# Patient Record
Sex: Male | Born: 1975 | Race: White | Hispanic: No | Marital: Single | Smoking: Never smoker
Health system: Southern US, Community
[De-identification: ages and names within clinical notes are randomized; demographics above are authoritative.]

## PROBLEM LIST (undated history)

## (undated) DIAGNOSIS — F32A Depression, unspecified: Secondary | ICD-10-CM

## (undated) DIAGNOSIS — F329 Major depressive disorder, single episode, unspecified: Secondary | ICD-10-CM

## (undated) DIAGNOSIS — F419 Anxiety disorder, unspecified: Secondary | ICD-10-CM

## (undated) DIAGNOSIS — Z8719 Personal history of other diseases of the digestive system: Secondary | ICD-10-CM

## (undated) HISTORY — PX: OTHER SURGICAL HISTORY: SHX169

## (undated) HISTORY — DX: Personal history of other diseases of the digestive system: Z87.19

---

## 2004-05-06 ENCOUNTER — Emergency Department (HOSPITAL_COMMUNITY): Admission: EM | Admit: 2004-05-06 | Discharge: 2004-05-06 | Payer: Self-pay | Admitting: Emergency Medicine

## 2004-06-18 ENCOUNTER — Encounter: Admission: RE | Admit: 2004-06-18 | Discharge: 2004-06-18 | Payer: Self-pay | Admitting: General Surgery

## 2004-07-07 ENCOUNTER — Inpatient Hospital Stay (HOSPITAL_COMMUNITY): Admission: RE | Admit: 2004-07-07 | Discharge: 2004-07-11 | Payer: Self-pay | Admitting: General Surgery

## 2004-07-07 ENCOUNTER — Encounter (INDEPENDENT_AMBULATORY_CARE_PROVIDER_SITE_OTHER): Payer: Self-pay | Admitting: *Deleted

## 2008-07-03 ENCOUNTER — Encounter: Payer: Self-pay | Admitting: Family Medicine

## 2008-07-15 ENCOUNTER — Emergency Department: Payer: Self-pay | Admitting: Emergency Medicine

## 2008-07-24 ENCOUNTER — Ambulatory Visit: Payer: Self-pay | Admitting: Family Medicine

## 2008-07-24 DIAGNOSIS — Z8719 Personal history of other diseases of the digestive system: Secondary | ICD-10-CM

## 2008-07-24 DIAGNOSIS — IMO0002 Reserved for concepts with insufficient information to code with codable children: Secondary | ICD-10-CM

## 2008-08-09 ENCOUNTER — Ambulatory Visit: Payer: Self-pay | Admitting: Family Medicine

## 2010-01-14 ENCOUNTER — Ambulatory Visit: Payer: Self-pay | Admitting: Family Medicine

## 2010-01-23 ENCOUNTER — Ambulatory Visit: Payer: Self-pay | Admitting: Family Medicine

## 2010-01-29 ENCOUNTER — Telehealth: Payer: Self-pay | Admitting: Family Medicine

## 2010-08-26 NOTE — Assessment & Plan Note (Signed)
Summary: CPX FOR DMV/CLE   Vital Signs:  Patient profile:   35 year old male Height:      72 inches Weight:      290.2 pounds BMI:     39.50 Temp:     97.7 degrees F oral Pulse rate:   64 / minute Pulse rhythm:   regular BP sitting:   110 / 70  (left arm) Cuff size:   large  Vitals Entered By: Benny Lennert CMA Duncan Dull) (January 23, 2010 8:37 AM)  Vision Screening:Left eye w/o correction: 20 / 15 Right Eye w/o correction: 20 / 15 Both eyes w/o correction:  20/ 15  Color vision testing: normal      Vision Entered By: Benny Lennert CMA Duncan Dull) (January 23, 2010 8:55 AM)   History of Present Illness: Chief complaint patient needs from filled out for health reasons for driving  Preventive Screening-Counseling & Management  Alcohol-Tobacco     Alcohol drinks/day: <1     Alcohol Counseling: not indicated; use of alcohol is not excessive or problematic     Tobacco Counseling: not indicated; no tobacco use  Caffeine-Diet-Exercise     Diet Counseling: to improve diet; diet is suboptimal     Does Patient Exercise: no     Exercise Counseling: to improve exercise regimen  Hep-HIV-STD-Contraception     HIV Risk: no risk noted     STD Risk: no risk noted      Sexual History:  currently monogamous.        Drug Use:  never.    Allergies (verified): No Known Drug Allergies  Past History:  Past medical, surgical, family and social histories (including risk factors) reviewed, and no changes noted (except as noted below).  Past Medical History: Diverticulitis, hx of, 2005  Past Surgical History: surgery - diverticulitis, 2005  Family History: Reviewed history from 07/24/2008 and no changes required. Prostate CA, GF Mental Illness, others  Social History: Reviewed history from 07/24/2008 and no changes required. Single recent bad break-up Lives beside mother [employer] studentDoes Patient Exercise:  no HIV Risk:  no risk noted STD Risk:  no risk noted Sexual History:   currently monogamous Drug Use:  never  Review of Systems  General: Denies fever, chills, sweats, and anorexia. Eyes: Denies blurring. ENT: Denies earache, ear discharge, decreased hearing, nasal congestion, and sore throat. CV: Denies chest pains, dyspnea on exertion, palpitations, and syncope. Resp: Denies cough, cough with exercise, dyspnea at rest, excessive sputum, nighttime cough or wheeze, and wheezing GI: Denies nausea, vomiting, diarrhea, constipation, change in bowel habits, abdominal pain, melena, BRBPR  GU: dysuria, discharge, frequency,genital sores, STD concern. MS: no back pain, joint pain, stiffness, and arthritis. Derm: No rash, itching, and dryness Neuro: No abnormal gait, frequent headaches, paresthesias, seizures, vertigo, and weakness Psych: No anxiety, behavioral problems, compulsive behavior, depression, hyperactivity, and inattentive. Endo: No polydipsia, polyphagia, polyuria, and unusual weight change Heme: No bruising or LAD Allergy: No urticaria or hayfever   Otherwise, the pertinent positives and negatives are listed above and in the HPI, otherwise a full review of systems has been reviewed and is negative unless noted positive.   Physical Exam  General:  Well-developed,well-nourished,in no acute distress; alert,appropriate and cooperative throughout examination Head:  Normocephalic and atraumatic without obvious abnormalities. No apparent alopecia or balding. Eyes:  pupils equal, pupils round, and pupils reactive to light.   Ears:  External ear exam shows no significant lesions or deformities.  Otoscopic examination reveals clear canals, tympanic membranes are  intact bilaterally without bulging, retraction, inflammation or discharge. Hearing is grossly normal bilaterally. Nose:  External nasal examination shows no deformity or inflammation. Nasal mucosa are pink and moist without lesions or exudates. Mouth:  Oral mucosa and oropharynx without lesions or  exudates.  Teeth in good repair. Neck:  No deformities, masses, or tenderness noted. Lungs:  Normal respiratory effort, chest expands symmetrically. Lungs are clear to auscultation, no crackles or wheezes. Heart:  Normal rate and regular rhythm. S1 and S2 normal without gallop, murmur, click, rub or other extra sounds. Abdomen:  Bowel sounds positive,abdomen soft and non-tender without masses, organomegaly or hernias noted. Extremities:  No clubbing, cyanosis, edema, or deformity noted with normal full range of motion of all joints.   Neurologic:  alert & oriented X3, sensation intact to light touch, and gait normal.   Skin:  Intact without suspicious lesions or rashes Cervical Nodes:  No lymphadenopathy noted Psych:  Cognition and judgment appear intact. Alert and cooperative with normal attention span and concentration. No apparent delusions, illusions, hallucinations   Impression & Recommendations:  Problem # 1:  HEALTH MAINTENANCE EXAM (ICD-V70.0) The patient's preventative maintenance and recommended screening tests for an annual wellness exam were reviewed in full today. Brought up to date unless services declined.  Counselled on the importance of diet, exercise, and its role in overall health and mortality. The patient's FH and SH was reviewed, including their home life, tobacco status, and drug and alcohol status.   work on [employer], diet, exercise  Other Orders: Venipuncture (16109) TLB-Lipid Panel (80061-LIPID) TLB-BMP (Basic Metabolic Panel-BMET) (80048-METABOL)  Current Allergies (reviewed today): No known allergies

## 2010-08-26 NOTE — Letter (Signed)
Summary: Medical Report Form/NCDMV  Medical Report Form/NCDMV   Imported By: Lanelle Bal 01/29/2010 11:42:58  _____________________________________________________________________  External Attachment:    Type:   Image     Comment:   External Document

## 2010-08-26 NOTE — Progress Notes (Signed)
  Phone Note Outgoing Call   Call placed by: Mills Koller Call placed to: Patient Summary of Call: Patient left without having lab work done. We have left messages on hiis machine to call. (without leaving a detailed message) HIPPA Initial call taken by: Mills Koller,  January 29, 2010 4:47 PM  Follow-up for Phone Call        ok Follow-up by: Hannah Beat MD,  January 30, 2010 8:09 AM

## 2010-12-12 NOTE — Op Note (Signed)
Alex Woods, Alex Woods                ACCOUNT NO.:  [id number]   MEDICAL RECORD NO.:  [id number]          PATIENT TYPE:  INP   LOCATION:  0001                         FACILITY:  [facility]   PHYSICIAN:  Gita Kudo, M.D. DATE OF BIRTH:  1975-10-18   DATE OF PROCEDURE:  07/07/2004  DATE OF DISCHARGE:                                 OPERATIVE REPORT   OPERATIVE PROCEDURE:  Laparotomy, sigmoid resection, repair of colovesical  fistula.   SURGEON:  Dr. Maryagnes Amos. Geophysicist/field seismologist, Field seismologist. Co-surgeon, Dr. Wanda Plump.   CLINICAL SUMMARY:  A 35 year old male with documented colovesical fistula  and abnormal colon brought in for surgery.   OPERATIVE FINDINGS:  The patient had an inflammatory mass in the sigmoid  colon that was adherent to the upper portion of the posterior surface of the  bladder. It was quite firm, and therefore frozen section was obtained which  showed benign change.   OPERATIVE PROCEDURE:  Under satisfactory general endotracheal anesthesia,  the patient was positioned in the supine position, prepped and draped for  cystoscopy, which was done by Dr. Aldean Ast, and stents were placed. He was  then repositioned and prepped and draped again. He received IV Cefotan  preoperative and had nasogastric and Foley catheters placed as well as the  ureteral stents.   A midline incision was made and carried into the peritoneum. Bleeders  controlled with cautery. Laparotomy revealed the findings mentioned above  plus normal feeling small bowel, stomach, and gallbladder. The NG tube was  in good position. There was a big inflammatory mass of the distal sigmoid  but not involving the rectum. It was adherent to the bladder. Then the  lateral reflections of the sigmoid were divided with the cautery and the  colon pinched off the bladder. A sigmoid resection was then performed. The  mesentery was incised with the cautery. Beginning proximally, the bowel was  divided between clamps, and the  proximal occluding clamp had been placed.  The dissection was carried down beyond the diseased portion into the very  distal sigmoid and above the peritoneal reflection. There, the bowel was  transected and sent for frozen section. While waiting, a hand-sewn, single  layer anastomosis of interrupted 3-0 silks, and simple, mattress, and Gambee  type were placed. At the completion of the anastomosis, it had good blood  supply, was patent, had no tension. The clamp was released.   Then, Dr. Boston Service performed the repair of the bladder which he  described separately.   After that was done, the abdomen was copiously lavaged with saline and  sutured dry. No attempt was made to reperitonealize. A large Blake drain was  lead down to the pelvis and out a lateral stab wound on the right. Then, the  abdomen closed in layers with a running #1 double stranded PDS suture from  the midline and fascia followed by staples for skin. A sterile absorbent  dressing was applied. The ureteral stents were removed. He went to the  recovery room from the operating room in good condition.      MRL/MEDQ  D:  07/07/2004  T:  07/07/2004  Job:  [id number]   cc:   Boston Service, M.D.  [address]  [place]  [place] [address]  Fax: [phone]   Stan Head. Cleta Alberts, M.D.  [address]  [place]  [place] [address]  Fax: [phone]

## 2010-12-12 NOTE — Op Note (Signed)
NAMEHOMER, MILLER                ACCOUNT NO.:  [id number]   MEDICAL RECORD NO.:  [id number]          PATIENT TYPE:  INP   LOCATION:  0001                         FACILITY:  [facility]   PHYSICIAN:  Boston Service, M.D.DATE OF BIRTH:  [birth year]   DATE OF PROCEDURE:  07/07/2004  DATE OF DISCHARGE:                                 OPERATIVE REPORT   GENERAL SURGERY:  1.  Gita Kudo, M.D.  2.  Anselm Pancoast. Zachery Dakins, M.D.   UROLOGY:  Boston Service, M.D.   PREOPERATIVE DIAGNOSIS:  A 35 year old male, colovesical fistula.  Cystoscopy indicates fistula is immediately behind the trigone.  Requested  stent placement prior to colovesical fistula repair in hopes of helping to  identify path of the distal ureters.   DESCRIPTION OF PROCEDURE:  The patient was prepped and draped in the dorsal  lithotomy position after institution of adequate level of general  anesthesia.  A well-lubricated 21-French panendoscope was gently inserted at  the urethral meatus. Normal urethra and sphincter.  Nonobstructive prostate.  Bladder showed an abundant amount of debris which was patiently irrigated  from the bladder over a period of about 10 or 15 minutes.  Once debris had  been evacuated, colovesical fistula herald patch was identified posterior  to the trigone.  There was surrounding edema and erythema around the right  and left orifices.  They were identified, however, and cannulated with a  floppy-tip guidewire which advanced easily into the upper pole calyces.  __________ Catheter was advanced over the floppy tip wire and position  within the renal pelvis was confirmed on both the right and left sides.  Floppy-tip wire was removed.  A Lunderquist ring torque wire was then  inserted inside of the 6-French end-hole ureteral stent to a distance of  about 20 cm.  Cystoscope was then withdrawn.  An 18-French Foley was  inserted and left to straight drain.  Stents were then tied to the Foley.  It was  left to straight drain.   Dr. Maryagnes Amos will dictate his portion of the procedure.      RH/MEDQ  D:  07/07/2004  T:  07/07/2004  Job:  [id number]

## 2010-12-12 NOTE — Discharge Summary (Signed)
NAMEDONDRELL, LOUDERMILK                ACCOUNT NO.:  [id number]   MEDICAL RECORD NO.:  [id number]          PATIENT TYPE:  INP   LOCATION:  0348                         FACILITY:  [facility]   PHYSICIAN:  Gita Kudo, M.D. DATE OF BIRTH:  1976/03/15   DATE OF ADMISSION:  07/07/2004  DATE OF DISCHARGE:  07/11/2004                                 DISCHARGE SUMMARY   CHIEF COMPLAINT:  1.  Diverticulitis.  2.  Colovesical fistula.   HISTORY OF PRESENT ILLNESS:  This 34 year old male is admitted for elective  surgery.  Work up of urinary tract problems include a colovesical fistula  and this was confirmed with CT scan showing the inflammation and fistula.  His general health is good.  He lost, intentionally, over 70 pounds in the  past year.  Barium enema confirmed the presence of this area.   LABORATORY STUDIES:  Pathology:  Section for sigmoid colon with severe  diverticulitis, inflammation and abscess.  Two benign nodes.   CBC:  Hemoglobin 15, hematocrit 43, white count 7900.  CMET within normal  limits.  Urinalysis was significant for many white cells.  He was A positive  and did not require any transfusions.   HOSPITAL COURSE:  On the morning of admission, the patient underwent a  sigmoid colon resection and repair of his colovesical fistula.  Boston Service, M.D., performed the urologic procedure.  Postoperatively he did  very well.  He was allow liquids and was kept comfortable with his PCA pump.  He improved to the point where he was ambulatory, afebrile and tolerating  liquids.  He had a bowel movement and was able to be discharged on his  fourth postoperative day.  I had removed his JP drain.  Written instructions  for followup were given.   DISCHARGE DIAGNOSIS:  Sigmoid colon diverticulitis with colovesical fistula.   OPERATIONS:  1.  Laparotomy.  2.  Repair of colovesical fistula.  3.  Sigmoid resection.   COMPLICATIONS:  None.   INFECTIONS:  None.   CONDITION AT  DISCHARGE:  Good.      MRL/MEDQ  D:  07/28/2004  T:  07/28/2004  Job:  [id number]   cc:   Boston Service, M.D.  [address]  [place]  [place] [address]  Fax: [phone]   Stan Head. Cleta Alberts, M.D.  [address]  [place]  [place] [address]  Fax: [phone]

## 2012-01-01 ENCOUNTER — Encounter: Payer: Self-pay | Admitting: Internal Medicine

## 2012-01-01 ENCOUNTER — Ambulatory Visit (INDEPENDENT_AMBULATORY_CARE_PROVIDER_SITE_OTHER): Payer: No Typology Code available for payment source | Admitting: Internal Medicine

## 2012-01-01 VITALS — BP 130/80 | HR 68 | Temp 98.4°F | Wt 255.0 lb

## 2012-01-01 DIAGNOSIS — M545 Low back pain, unspecified: Secondary | ICD-10-CM | POA: Insufficient documentation

## 2012-01-01 MED ORDER — CYCLOBENZAPRINE HCL 10 MG PO TABS: 10.0000 mg | ORAL_TABLET | Freq: Every evening | ORAL | Status: AC | PRN

## 2012-01-01 NOTE — Patient Instructions (Signed)
Please consider using ibuprofen (advil) 800mg  (4 tabs) up to 3 times daily Please consider chiropractic or massage I can make referral to physical therapy if you wish
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
## 2012-01-08 ENCOUNTER — Telehealth: Payer: Self-pay

## 2012-01-08 NOTE — Telephone Encounter (Signed)
Pt wants to discuss lack of satisfaction with 01/01/12 office visit.Offered pt to speak with office manager but pt only wants to speak with Dr Patsy Lager. Pt states rt ankle,rt knee and back are no better. Pt cannot schedule f/u appt due to no transportation.[facility]. Pt contact # [phone].

## 2012-01-10 NOTE — Telephone Encounter (Signed)
It looks like he is coming to see me Monday. It is probably better if I see him and evaluate him, so I can tell him what I think is going on with his  Joints.    We can talk about his concerns then.

## 2012-01-11 ENCOUNTER — Ambulatory Visit: Payer: No Typology Code available for payment source | Admitting: Family Medicine

## 2012-01-11 DIAGNOSIS — Z0289 Encounter for other administrative examinations: Secondary | ICD-10-CM

## 2012-01-11 NOTE — Telephone Encounter (Signed)
Left vm for pt to callback 

## 2012-01-13 ENCOUNTER — Ambulatory Visit (INDEPENDENT_AMBULATORY_CARE_PROVIDER_SITE_OTHER)
Admission: RE | Admit: 2012-01-13 | Discharge: 2012-01-13 | Disposition: A | Discharge status: Home / Self Care | Payer: No Typology Code available for payment source | Source: Ambulatory Visit | Attending: Family Medicine | Admitting: Family Medicine

## 2012-01-13 ENCOUNTER — Ambulatory Visit (INDEPENDENT_AMBULATORY_CARE_PROVIDER_SITE_OTHER): Payer: No Typology Code available for payment source | Admitting: Family Medicine

## 2012-01-13 ENCOUNTER — Encounter: Payer: Self-pay | Admitting: Family Medicine

## 2012-01-13 VITALS — BP 120/72 | HR 83 | Temp 98.5°F | Ht 72.0 in | Wt 251.5 lb

## 2012-01-13 DIAGNOSIS — M545 Low back pain: Secondary | ICD-10-CM

## 2012-01-13 DIAGNOSIS — M25569 Pain in unspecified knee: Secondary | ICD-10-CM

## 2012-01-13 MED ORDER — PREDNISONE 20 MG PO TABS: ORAL_TABLET | ORAL | Status: AC

## 2012-01-13 MED ORDER — TIZANIDINE HCL 4 MG PO TABS: 4.0000 mg | ORAL_TABLET | Freq: Every evening | ORAL | Status: AC
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
## 2012-01-27 ENCOUNTER — Ambulatory Visit (INDEPENDENT_AMBULATORY_CARE_PROVIDER_SITE_OTHER): Payer: No Typology Code available for payment source | Admitting: Family Medicine

## 2012-01-27 ENCOUNTER — Encounter: Payer: Self-pay | Admitting: Family Medicine

## 2012-01-27 VITALS — BP 155/80 | HR 65 | Temp 97.7°F | Resp 18 | Ht 68.5 in | Wt 258.0 lb

## 2012-01-27 DIAGNOSIS — M549 Dorsalgia, unspecified: Secondary | ICD-10-CM

## 2012-01-27 DIAGNOSIS — F411 Generalized anxiety disorder: Secondary | ICD-10-CM

## 2012-01-27 DIAGNOSIS — F419 Anxiety disorder, unspecified: Secondary | ICD-10-CM

## 2012-01-27 MED ORDER — CLONAZEPAM 0.5 MG PO TABS: 0.5000 mg | ORAL_TABLET | Freq: Two times a day (BID) | ORAL | Status: DC | PRN

## 2012-01-27 NOTE — Patient Instructions (Signed)
Follow up with Dr. Patsy Lager in 1 month.
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
## 2012-02-01 ENCOUNTER — Ambulatory Visit: Payer: No Typology Code available for payment source | Admitting: Family Medicine

## 2012-02-01 DIAGNOSIS — Z0289 Encounter for other administrative examinations: Secondary | ICD-10-CM

## 2012-02-02 ENCOUNTER — Telehealth: Payer: Self-pay

## 2012-02-02 NOTE — Telephone Encounter (Signed)
Patient advised and will come in for appt tomorrow

## 2012-02-02 NOTE — Telephone Encounter (Signed)
Patient called again with same request as previous notes.  Has someone called the patient back to discuss?  Thanks

## 2012-02-02 NOTE — Telephone Encounter (Signed)
Pt left v/m seen [facility] last week prescribed Clonazepam 0.5 mg; worked great, pt slept well and anxiety or stress attacks gone;pt can tell difference in his behavior.Clonazepam 0.5 mg was not strong enough so pt took 2 pills and that worked great. Pt request Clonazepam 1 mg sent to [facility].Please advise.

## 2012-02-02 NOTE — Telephone Encounter (Signed)
That would not be appropriate to prescribe over the phone, which I never do.  I was concerned he was under the influence at his last office visit, which we talked about. Whenever I have those concerns, I avoid controlled substances like clonazepam.

## 2012-02-03 ENCOUNTER — Encounter: Payer: Self-pay | Admitting: Family Medicine

## 2012-02-03 ENCOUNTER — Ambulatory Visit (INDEPENDENT_AMBULATORY_CARE_PROVIDER_SITE_OTHER): Payer: No Typology Code available for payment source | Admitting: Family Medicine

## 2012-02-03 VITALS — BP 120/72 | HR 72 | Temp 98.0°F | Ht 68.5 in | Wt 262.8 lb

## 2012-02-03 DIAGNOSIS — F419 Anxiety disorder, unspecified: Secondary | ICD-10-CM

## 2012-02-03 DIAGNOSIS — F411 Generalized anxiety disorder: Secondary | ICD-10-CM

## 2012-02-03 MED ORDER — CITALOPRAM HYDROBROMIDE 20 MG PO TABS: 20.0000 mg | ORAL_TABLET | Freq: Every day | ORAL | Status: AC

## 2012-02-03 NOTE — Patient Instructions (Addendum)
F/u 1 month 
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
## 2012-02-11 ENCOUNTER — Ambulatory Visit (INDEPENDENT_AMBULATORY_CARE_PROVIDER_SITE_OTHER): Payer: No Typology Code available for payment source | Admitting: Family Medicine

## 2012-02-11 VITALS — BP 130/88 | HR 81 | Temp 98.6°F | Resp 16 | Ht 69.0 in | Wt 260.6 lb

## 2012-02-11 DIAGNOSIS — F411 Generalized anxiety disorder: Secondary | ICD-10-CM

## 2012-02-11 MED ORDER — CLONAZEPAM 0.5 MG PO TABS: 0.5000 mg | ORAL_TABLET | Freq: Two times a day (BID) | ORAL | Status: AC | PRN
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
## 2012-02-21 ENCOUNTER — Emergency Department: Payer: Self-pay | Admitting: Emergency Medicine

## 2012-02-24 ENCOUNTER — Emergency Department (HOSPITAL_COMMUNITY)
Admission: EM | Admit: 2012-02-24 | Discharge: 2012-02-24 | Disposition: A | Discharge status: Home / Self Care | Payer: No Typology Code available for payment source | Attending: Emergency Medicine | Admitting: Emergency Medicine

## 2012-02-24 ENCOUNTER — Encounter (HOSPITAL_COMMUNITY): Payer: Self-pay | Admitting: Emergency Medicine

## 2012-02-24 DIAGNOSIS — F329 Major depressive disorder, single episode, unspecified: Secondary | ICD-10-CM | POA: Insufficient documentation

## 2012-02-24 DIAGNOSIS — F1120 Opioid dependence, uncomplicated: Secondary | ICD-10-CM

## 2012-02-24 DIAGNOSIS — F1920 Other psychoactive substance dependence, uncomplicated: Secondary | ICD-10-CM | POA: Insufficient documentation

## 2012-02-24 DIAGNOSIS — F411 Generalized anxiety disorder: Secondary | ICD-10-CM | POA: Insufficient documentation

## 2012-02-24 DIAGNOSIS — F3289 Other specified depressive episodes: Secondary | ICD-10-CM | POA: Insufficient documentation

## 2012-02-24 HISTORY — DX: Anxiety disorder, unspecified: F41.9

## 2012-02-24 HISTORY — DX: Depression, unspecified: F32.A

## 2012-02-24 HISTORY — DX: Major depressive disorder, single episode, unspecified: F32.9

## 2012-02-24 LAB — COMPREHENSIVE METABOLIC PANEL
ALT: 20 U/L (ref 0–53)
AST: 22 U/L (ref 0–37)
Albumin: 3.8 g/dL (ref 3.5–5.2)
Alkaline Phosphatase: 68 U/L (ref 39–117)
BUN: 11 mg/dL (ref 6–23)
CO2: 27 mEq/L (ref 19–32)
Calcium: 9.1 mg/dL (ref 8.4–10.5)
Chloride: 103 mEq/L (ref 96–112)
Creatinine, Ser: 0.72 mg/dL (ref 0.50–1.35)
GFR calc Af Amer: >90 mL/min (ref >90)
GFR calc non Af Amer: >90 mL/min (ref >90)
Glucose, Bld: 91 mg/dL (ref 70–99)
Potassium: 4 mEq/L (ref 3.5–5.1)
Sodium: 138 mEq/L (ref 135–145)
Total Bilirubin: 0.3 mg/dL (ref 0.3–1.2)
Total Protein: 6.6 g/dL (ref 6.0–8.3)

## 2012-02-24 LAB — CBC
HCT: 38.6 % — ABNORMAL LOW (ref 39.0–52.0)
Hemoglobin: 13.2 g/dL (ref 13.0–17.0)
MCH: 30.1 pg (ref 26.0–34.0)
MCHC: 34.2 g/dL (ref 30.0–36.0)
MCV: 87.9 fL (ref 78.0–100.0)
Platelets: 276 10*3/uL (ref 150–400)
RBC: 4.39 MIL/uL (ref 4.22–5.81)
RDW: 12.4 % (ref 11.5–15.5)
WBC: 8.5 10*3/uL (ref 4.0–10.5)

## 2012-02-24 LAB — ACETAMINOPHEN LEVEL: Acetaminophen (Tylenol), Serum: <15 ug/mL (ref 10–30)

## 2012-02-24 LAB — ETHANOL: Alcohol, Ethyl (B): <11 mg/dL (ref 0–11)

## 2012-02-24 NOTE — ED Notes (Signed)
Pt is unable to urinate at this time. 

## 2012-02-24 NOTE — ED Notes (Signed)
Pt presenting to ed with c/o needing detox opiates and benzo's pt states he had xanax and valium earlier today. Pt is very sleep in triage. Pt states he ate xanax bars today at 9-9:30. Pt is alert and oriented at this time. Pt denies SI/HI at this time.

## 2012-02-24 NOTE — ED Notes (Signed)
Pt's friend Tery Sanfilippo [phone] and Angelique Holm [phone]

## 2012-02-24 NOTE — ED Provider Notes (Signed)
History     CSN: [id number]  Arrival date & time 02/24/12  1540   First MD Initiated Contact with Patient 02/24/12 1710      Chief Complaint  Patient presents with  . Medical Clearance    (Consider location/radiation/quality/duration/timing/severity/associated sxs/prior treatment) The history is provided by the patient.   oral male with a history of anxiety and depression presents to the emergency department with a chief complaint of opiate and benzodiazepine abuse and request for assistance with detox. Has been buying Xanax, Valium, oxycodone off the street for some time. In, he was stopped by police officer recently and charged with possession of heroin that he reports was a friend's drugs- this appears to be the motivating factor for cessation of drug use. Denies any suicidal ideation, homicidal ideation, hallucinations. Denies any physical complaints. No prior treatment.  Past Medical History  Diagnosis Date  . Personal history of other diseases of digestive system   . Anxiety   . Depression     Past Surgical History  Procedure Date  . Diverticulitis     No family history on file.  History  Substance Use Topics  . Smoking status: Never Smoker   . Smokeless tobacco: Never Used  . Alcohol Use: Yes     rarely      Review of Systems 10 systems reviewed and are negative for acute change except as noted in the HPI.  Allergies  Review of patient's allergies indicates no known allergies.  Home Medications   Current Outpatient Rx  Name Route Sig Dispense Refill  . CITALOPRAM HYDROBROMIDE 20 MG PO TABS Oral Take 1 tablet (20 mg total) by mouth daily. 30 tablet 3  . CLONAZEPAM 0.5 MG PO TABS Oral Take 1 tablet (0.5 mg total) by mouth 2 (two) times daily as needed for anxiety. 30 tablet 1    BP 100/64  Pulse 56  Temp 97.9 F (36.6 C) (Oral)  Resp 20  SpO2 100%  Physical Exam  Nursing note and vitals reviewed. Constitutional: He appears well-developed and  well-nourished. No distress.  HENT:  Head: Normocephalic and atraumatic.  Right Ear: External ear normal.  Left Ear: External ear normal.       MMM  Eyes: Conjunctivae are normal. Pupils are equal, round, and reactive to light.  Neck: Neck supple.  Cardiovascular: Normal rate and regular rhythm.   Pulmonary/Chest: Effort normal. No respiratory distress.  Abdominal: Soft. He exhibits no distension. There is no tenderness.  Musculoskeletal: He exhibits no edema.  Neurological:       Drowsy but awake, easily arouses and is oriented x3. One awoken, speech is clear and appropriate. Gait is steady. Moves all extremities well.  Skin: Skin is warm and dry.    ED Course  Procedures (including critical care time)  Labs Reviewed  CBC - Abnormal; Notable for the following:    HCT 38.6 (*)     All other components within normal limits           No results found.   Dx 1: Polysubstance abuse   MDM  Patient with request for assistance with benzodiazepine and opiate detox. He does not want inpatient treatment. I will provide him with resources for outpatient treatment. Return precautions were discussed. No indication for involuntary commitment.        Shaaron Adler, [place] 02/24/12 [phone]

## 2012-02-24 NOTE — ED Notes (Signed)
Phoned pt's friend Riki Rusk whom states he will be picking pt up in about 

## 2012-02-25 NOTE — ED Provider Notes (Signed)
Medical screening examination/treatment/procedure(s) were performed by non-physician practitioner and as supervising physician I was immediately available for consultation/collaboration.   Nat Christen, MD 02/25/12 [phone]

## 2012-03-09 ENCOUNTER — Ambulatory Visit: Payer: No Typology Code available for payment source | Admitting: Family Medicine

## 2012-03-09 DIAGNOSIS — Z0289 Encounter for other administrative examinations: Secondary | ICD-10-CM

## 2012-04-08 ENCOUNTER — Other Ambulatory Visit: Payer: Self-pay | Admitting: Family Medicine

## 2012-04-08 NOTE — Telephone Encounter (Signed)
Refill request for tizanidine 4 mg 1 PO QHS. Last filled in June. Not on current med list. See encounters since July and advise please.

## 2012-04-08 NOTE — Telephone Encounter (Signed)
Okay to await Dr. Cyndie Chime recommendations.

## 2012-04-08 NOTE — Telephone Encounter (Signed)
Denied, drug abuser, recent intoxication in office, recent felony arrest for heroin and other drugs. No controlled substances from [facility].  Denial sent by me to pharmacy.

## 2012-07-11 ENCOUNTER — Encounter: Payer: No Typology Code available for payment source | Admitting: Family Medicine

## 2019-05-18 ENCOUNTER — Encounter: Payer: Self-pay | Admitting: Gastroenterology

## 2019-06-20 ENCOUNTER — Ambulatory Visit: Payer: No Typology Code available for payment source | Admitting: Gastroenterology

## 2019-08-01 ENCOUNTER — Ambulatory Visit: Payer: No Typology Code available for payment source | Admitting: Gastroenterology

## 2021-03-20 ENCOUNTER — Other Ambulatory Visit: Payer: Self-pay

## 2021-03-20 ENCOUNTER — Ambulatory Visit
Admission: EM | Admit: 2021-03-20 | Discharge: 2021-03-20 | Disposition: A | Discharge status: Home / Self Care | Payer: No Typology Code available for payment source | Attending: Emergency Medicine | Admitting: Emergency Medicine

## 2021-03-20 ENCOUNTER — Ambulatory Visit: Payer: No Typology Code available for payment source

## 2021-03-20 ENCOUNTER — Ambulatory Visit (INDEPENDENT_AMBULATORY_CARE_PROVIDER_SITE_OTHER): Payer: Self-pay

## 2021-03-20 DIAGNOSIS — M25571 Pain in right ankle and joints of right foot: Secondary | ICD-10-CM

## 2021-03-20 DIAGNOSIS — S93401A Sprain of unspecified ligament of right ankle, initial encounter: Secondary | ICD-10-CM

## 2021-03-20 DIAGNOSIS — S61011A Laceration without foreign body of right thumb without damage to nail, initial encounter: Secondary | ICD-10-CM

## 2021-03-20 DIAGNOSIS — L089 Local infection of the skin and subcutaneous tissue, unspecified: Secondary | ICD-10-CM

## 2021-03-20 MED ORDER — DOXYCYCLINE HYCLATE 100 MG PO CAPS: 100.0000 mg | ORAL_CAPSULE | Freq: Two times a day (BID) | ORAL | 0 refills | Status: AC

## 2021-03-20 NOTE — ED Triage Notes (Signed)
Patient presents to Urgent Care with multiple complaints of ankle injury and right thumb laceration. Pt states he fell and twisted his ankle x 3 weeks ago. Pain has not improved continues to get worse. Treating pain with tylenol. He states he also cut his right thumb with a knife on Saturday. Pt reports being up to date on his Tetanus. Cleansing laceration with peroxide/alcohol and applying neosporin. He is concerned with possible infection.   Denies fever.

## 2021-03-20 NOTE — Discharge Instructions (Addendum)
Right thumb:  Take doxycycline as prescribed.  Keep right hand elevated above heart level and iced throughout the day.  If you begin to notice any worsening of symptoms to the right thumb such as increased drainage, redness, pain or fever you need to go to the emergency department for evaluation.  Right ankle:  I recommend the following for at-home care:   RICE: Rest, Ice, Compression, Elevation Ice: Wrapped in towel or rag, applied to affected area for 15 minutes several times daily. Compression with ACE wrap or brace. Elevation to or above level of heart. Take Tylenol and/or ibuprofen over-the-counter as long as there are no contraindications to you taking these medications.

## 2021-03-20 NOTE — ED Provider Notes (Signed)
Chief Complaint   Chief Complaint  Patient presents with   Ankle Injury   Finger Injury    Right Thumb infection      Subjective, HPI  Alex Woods is a 45 y.o. male who presents with right ankle injury and right thumb laceration.  Patient reports jumping off of stage and twisting his ankle about 3 weeks ago.  Patient states that his ankle has continued to get worse.  Patient reports discomfort to the lateral aspect of his right ankle and the anterior aspect.  Patient reports that he is treating the pain with Tylenol which has helped some.  He also reports cutting his right thumb with a knife on Saturday and states that it "blew up" over the last couple of days.  Patient reports that he initially was not able to move the finger when it began to swell, but states that he is now able to move the finger.  Patient states he is up-to-date on his tetanus vaccine.  He has been cleansing the area with peroxide and alcohol.  He has been applying topical Neosporin.  No fever, chills, vomiting or additional symptoms reported today.  History obtained from patient.  Patient's problem list, past medical and social history, medications, and allergies were reviewed by me and updated in Epic.    ROS  See HPI.  Objective   Vitals:   03/20/21 0819  BP: 122/77  Pulse: 74  Resp: 16  Temp: 97.7 F (36.5 C)  SpO2: 95%    Vital signs and nursing note reviewed.   General: Appears well-developed and well-nourished. No acute distress.  Head: Normocephalic and atraumatic.   Neck: Normal range of motion, neck is supple.  Cardiovascular: Normal rate. Pulm/Chest: No respiratory distress.  Musculoskeletal: Right ankle: Moderate swelling noted about entire right ankle without any TTP or bruising noted.  Right thumb: Severe swelling noted about entire right with erythema and laceration noted to medial aspect measuring approximately 1.5 cm without purulent drainage.  No TTP over right thumb.  Full range of  motion to thumb and ankle.  5/5 strength, full sensation, 2+  pulses, < 2 sec cap refill.  Neurological: Alert and oriented to person, place, and time.  Skin: Skin is warm and dry.   Psychiatric: Normal mood, affect, behavior, and thought content.    Data  No results found for any visits on 03/20/21.   Imaging Right ankle: On my read, no acute fracture or dislocation. Pending final interpretation.   Assessment & Plan  1. Laceration of right thumb with infection, initial encounter - doxycycline (VIBRAMYCIN) 100 MG capsule; Take 1 capsule (100 mg total) by mouth 2 (two) times daily.  Dispense: 20 capsule; Refill: 0  2. Sprain of right ankle, unspecified ligament, initial encounter - Apply ace wrap; Standing - Apply ace wrap  45 y.o. male presents with right ankle injury and right thumb laceration.  Patient reports jumping off of stage and twisting his ankle about 3 weeks ago.  Patient states that his ankle has continued to get worse.  Patient reports discomfort to the lateral aspect of his right ankle and the anterior aspect.  Patient reports that he is treating the pain with Tylenol which has helped some.  He also reports cutting his right thumb with a knife on Saturday and states that it "blew up" over the last couple of days.  Patient reports that he initially was not able to move the finger when it began to swell, but states that  he is now able to move the finger.  Patient states he is up-to-date on his tetanus vaccine.  He has been cleansing the area with peroxide and alcohol.  He has been applying topical Neosporin.  No fever, chills, vomiting or additional symptoms reported today.  Chart review completed.  Given appearance of right thumb, concern for underlying infection, will Rx doxycycline to the patient's preferred pharmacy and advised about home treatment and care to include ice, elevation.  Did advise the patient that if he does not notice any significant improvement to the right thumb  over the next 2 to 3 days he will need to present to the emergency department for further evaluation.  Right ankle x-ray reveals:no acute fracture or dislocation, As read by me.  Pending final interpretation. Likely, right ankle sprain. ACE wrapped in clinic and advised of at home treatment and care as outlined in his AVS to include RICE measures.  Advised that if he does not begin to feel better over the next 7 days he will need to follow-up with [facility].  Plan:   Discharge Instructions      Right thumb:  Take doxycycline as prescribed.  Keep right hand elevated above heart level and iced throughout the day.  If you begin to notice any worsening of symptoms to the right thumb such as increased drainage, redness, pain or fever you need to go to the emergency department for evaluation.  Right ankle:  I recommend the following for at-home care:   RICE: Rest, Ice, Compression, Elevation Ice: Wrapped in towel or rag, applied to affected area for 15 minutes several times daily. Compression with ACE wrap or brace. Elevation to or above level of heart. Take Tylenol and/or ibuprofen over-the-counter as long as there are no contraindications to you taking these medications.         Amalia Greenhouse, [place] 03/20/21 [phone]

## 2022-12-05 IMAGING — DX DG ANKLE COMPLETE 3+V*R*
3 series · 3 of 3 positions shown · non-contrast
Comparison: None.

CLINICAL DATA: 45-year-old male with persistent pain 3 weeks after
fall. Pain with weight-bearing and going upstairs.

EXAM:
RIGHT ANKLE - COMPLETE 3+ VIEW

[ankle ap]
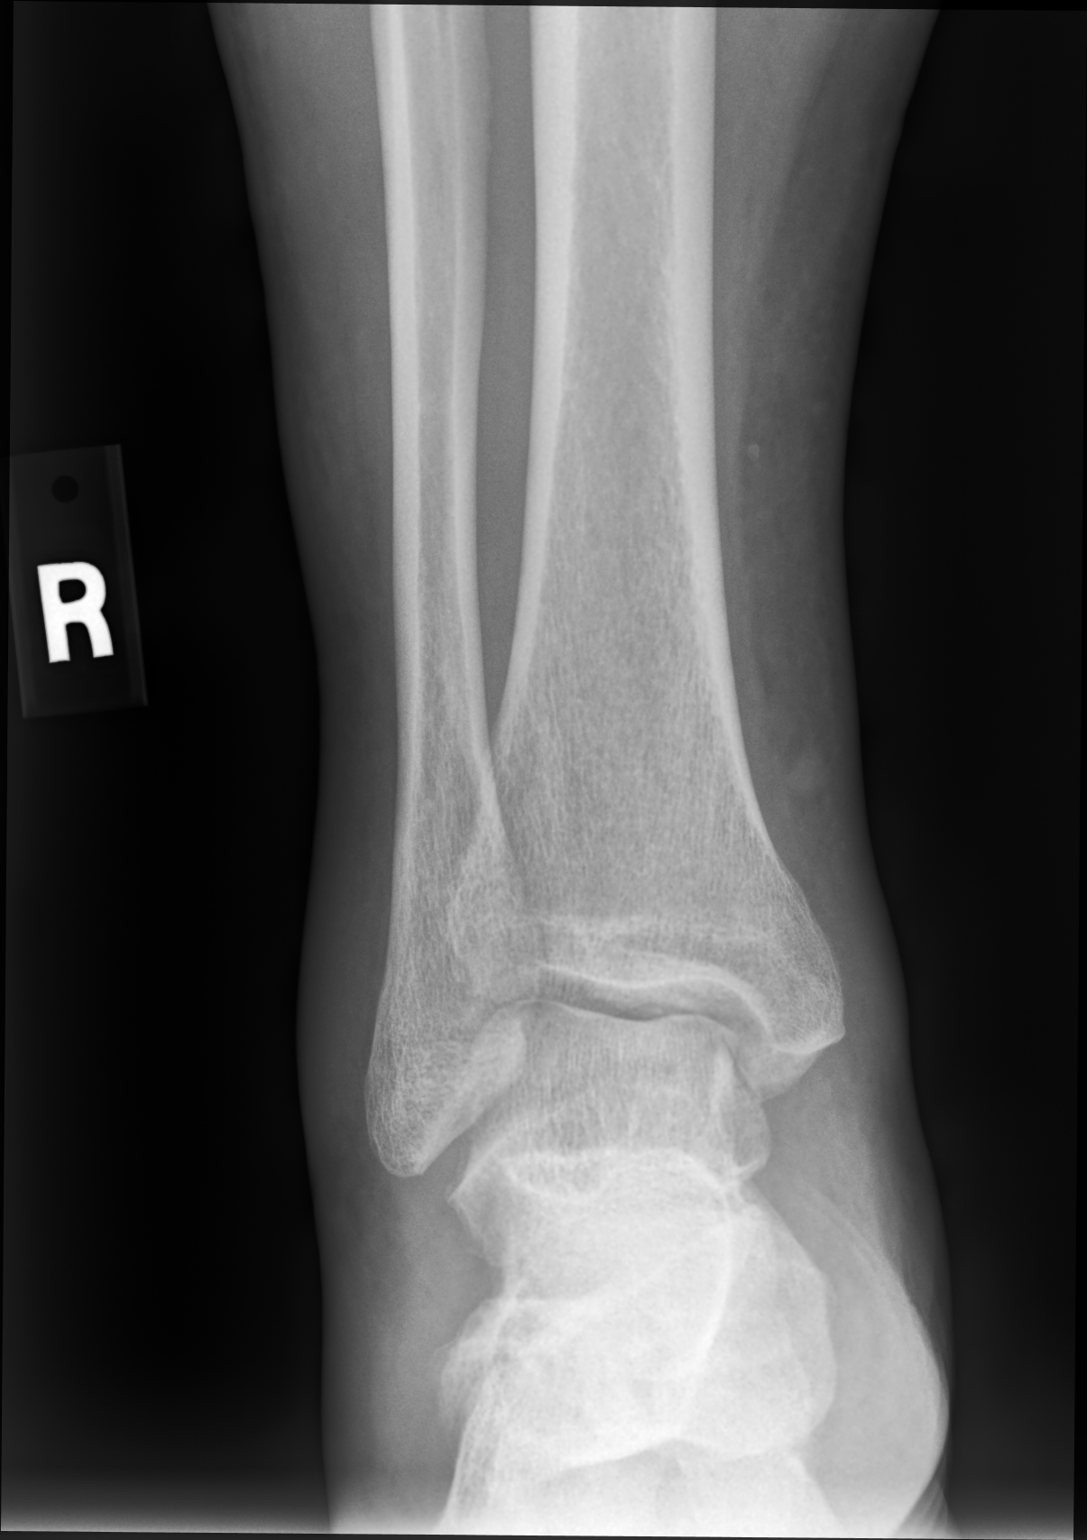

[ankle mlo]
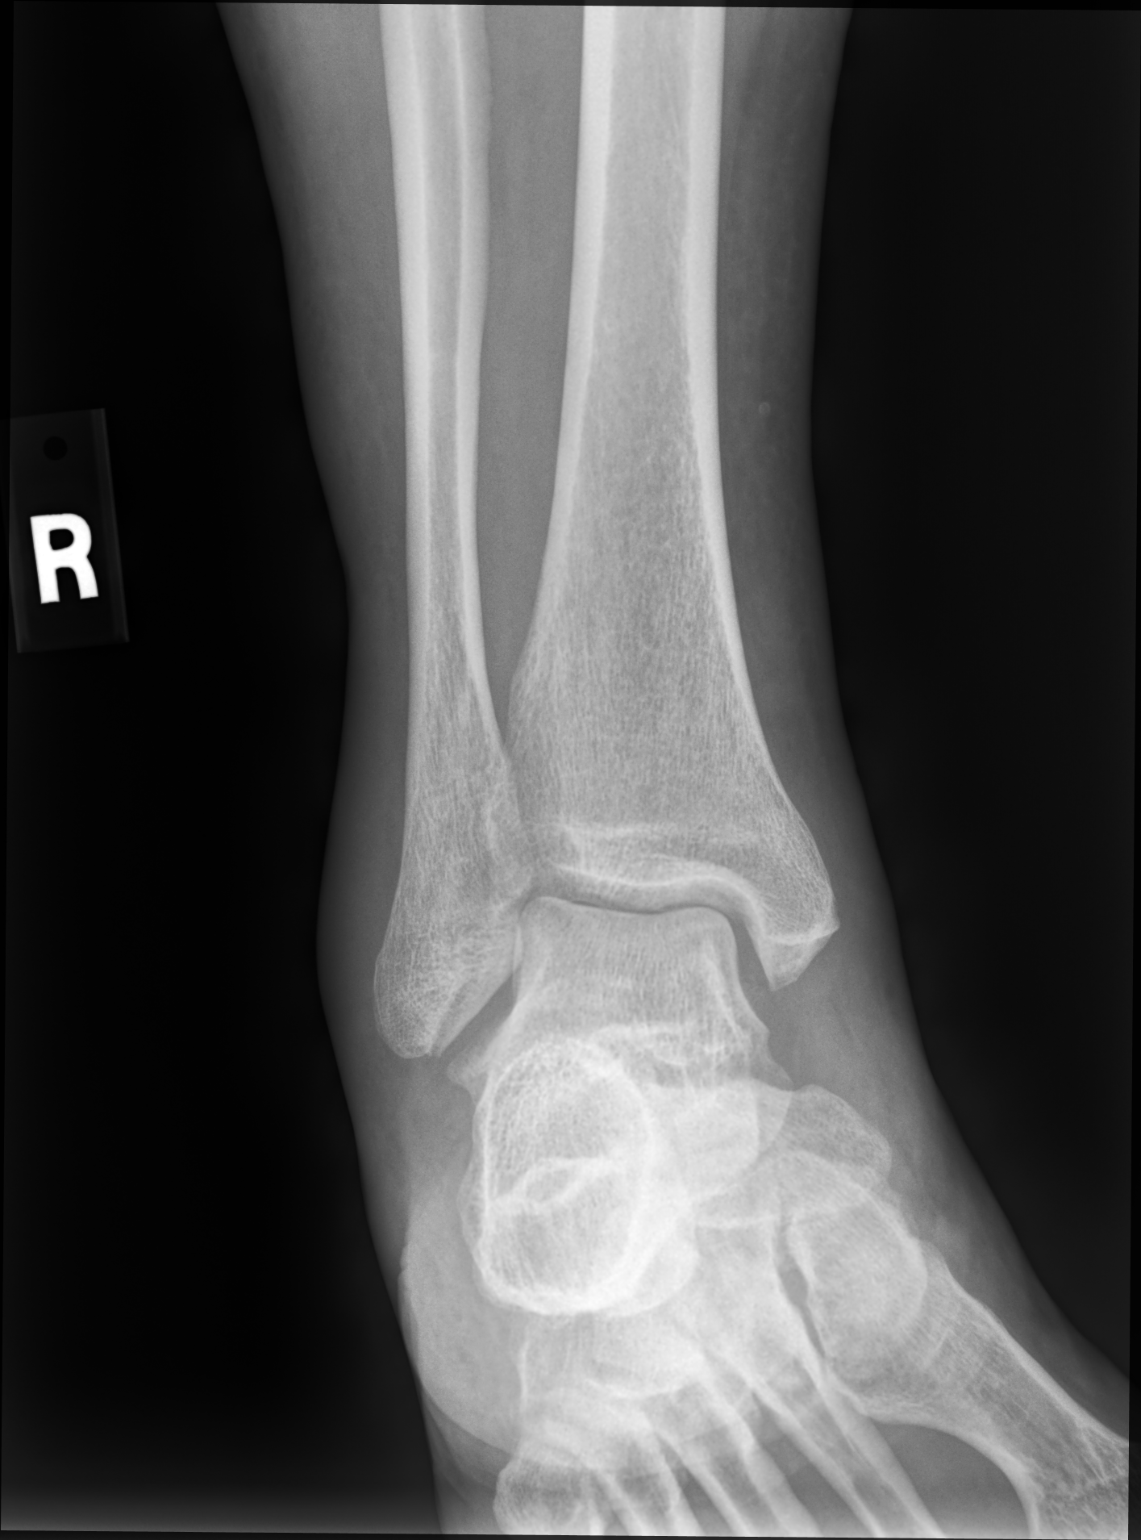

[ankle lat]
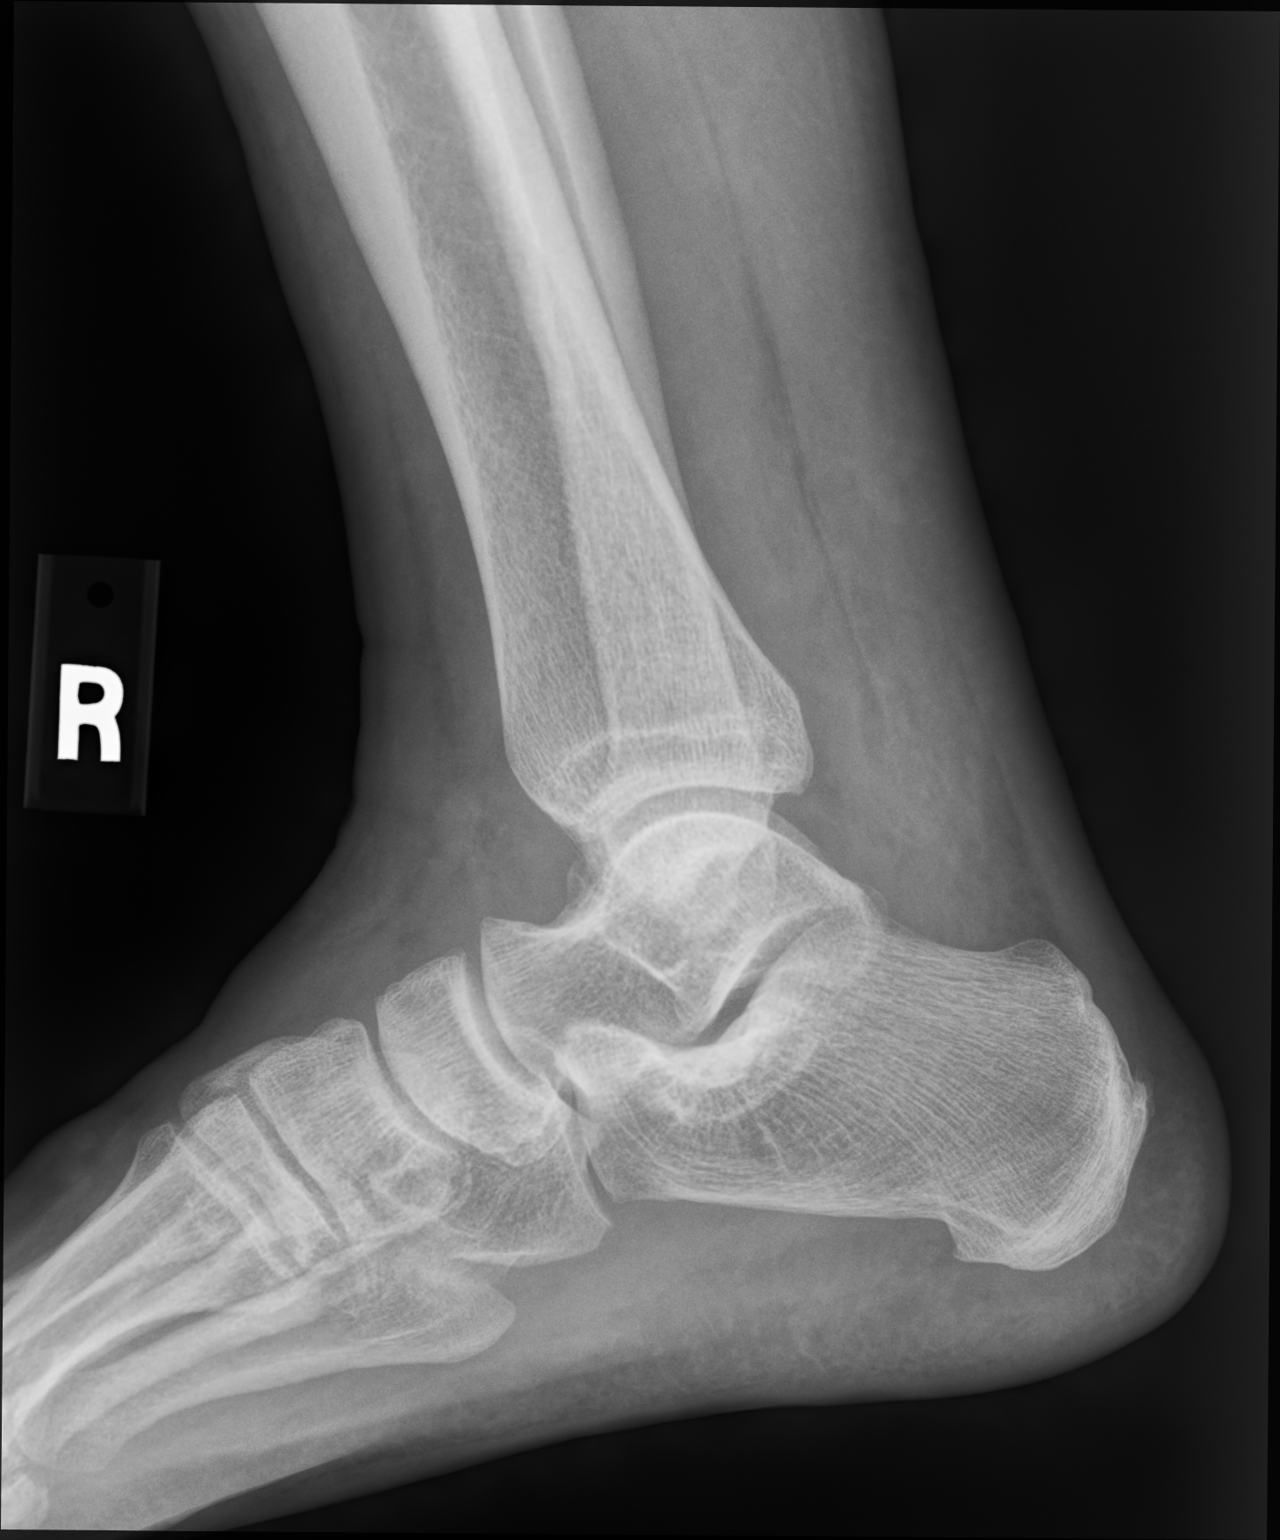

[3 of 3 positions shown; findings below may reference images not displayed]

FINDINGS: Bone mineralization is within normal limits. Preserved mortise joint
alignment. No convincing joint effusion. Talar dome intact. Distal
tibia, fibula, and calcaneus appear intact. Visible bones of the
right foot appear intact. There is generalized soft tissue swelling.
IMPRESSION: Generalized soft tissue swelling with no fracture or dislocation
identified about the right ankle.
